# Patient Record
Sex: Female | Born: 1985 | Race: White | Hispanic: No | Marital: Single
Health system: Southern US, Community
[De-identification: ages and names within clinical notes are randomized; demographics above are authoritative.]

## PROBLEM LIST (undated history)

## (undated) DIAGNOSIS — K5190 Ulcerative colitis, unspecified, without complications: Secondary | ICD-10-CM

## (undated) DIAGNOSIS — M5430 Sciatica, unspecified side: Secondary | ICD-10-CM

## (undated) DIAGNOSIS — E282 Polycystic ovarian syndrome: Secondary | ICD-10-CM

---

## 2015-04-04 ENCOUNTER — Encounter: Payer: Self-pay | Admitting: Emergency Medicine

## 2015-04-04 ENCOUNTER — Emergency Department
Admission: EM | Admit: 2015-04-04 | Discharge: 2015-04-04 | Disposition: A | Discharge status: Home / Self Care | Payer: BLUE CROSS/BLUE SHIELD | Attending: Emergency Medicine | Admitting: Emergency Medicine

## 2015-04-04 ENCOUNTER — Emergency Department: Payer: BLUE CROSS/BLUE SHIELD

## 2015-04-04 DIAGNOSIS — Y998 Other external cause status: Secondary | ICD-10-CM | POA: Insufficient documentation

## 2015-04-04 DIAGNOSIS — X58XXXA Exposure to other specified factors, initial encounter: Secondary | ICD-10-CM | POA: Insufficient documentation

## 2015-04-04 DIAGNOSIS — Y9344 Activity, trampolining: Secondary | ICD-10-CM | POA: Insufficient documentation

## 2015-04-04 DIAGNOSIS — M23231 Derangement of other medial meniscus due to old tear or injury, right knee: Secondary | ICD-10-CM | POA: Insufficient documentation

## 2015-04-04 DIAGNOSIS — Y9289 Other specified places as the place of occurrence of the external cause: Secondary | ICD-10-CM | POA: Diagnosis not present

## 2015-04-04 DIAGNOSIS — M2391 Unspecified internal derangement of right knee: Secondary | ICD-10-CM

## 2015-04-04 DIAGNOSIS — S8991XA Unspecified injury of right lower leg, initial encounter: Secondary | ICD-10-CM | POA: Diagnosis present

## 2015-04-04 HISTORY — DX: Ulcerative colitis, unspecified, without complications: K51.90

## 2015-04-04 HISTORY — DX: Polycystic ovarian syndrome: E28.2

## 2015-04-04 HISTORY — DX: Sciatica, unspecified side: M54.30

## 2015-04-04 MED ORDER — ONDANSETRON HCL 4 MG/2ML IJ SOLN
INTRAMUSCULAR | Status: AC
  Filled 2015-04-04: qty 2

## 2015-04-04 MED ORDER — MORPHINE SULFATE (PF) 4 MG/ML IV SOLN
INTRAVENOUS | Status: AC
  Filled 2015-04-04: qty 1

## 2015-04-04 MED ORDER — MORPHINE SULFATE (PF) 4 MG/ML IV SOLN
4.0000 mg | Freq: Once | INTRAVENOUS | Status: AC
  Administered 2015-04-04: 4 mg via INTRAVENOUS

## 2015-04-04 MED ORDER — ONDANSETRON HCL 4 MG/2ML IJ SOLN
4.0000 mg | Freq: Once | INTRAMUSCULAR | Status: AC
  Administered 2015-04-04: 4 mg via INTRAVENOUS

## 2015-04-04 NOTE — ED Provider Notes (Addendum)
[facility] Emergency Department Provider Note  ____________________________________________  Time seen: Approximately 8:31 PM  I have reviewed the triage vital signs and the nursing notes.   HISTORY  Chief Complaint Knee Injury    HPI Priscilla Scott is a 29 y.o. female patient reports she was jumping on a trampoline and landed wrong and had something happen to her knee. Patient reports severe pain just below her right knee and it feels like it grinds every time she moves it.   Past Medical History  Diagnosis Date  . PCOS (polycystic ovarian syndrome)   . Sciatica   . Ulcerative colitis     There are no active problems to display for this patient.   No past surgical history on file.  No current outpatient prescriptions on file.  Allergies Review of patient's allergies indicates no known allergies.  No family history on file.  Social History Social History  Substance Use Topics  . Smoking status: None  . Smokeless tobacco: None  . Alcohol Use: None    Review of Systems Constitutional: No fever/chills Eyes: No visual changes. ENT: No sore throat. Cardiovascular: Denies chest pain. Respiratory: Denies shortness of breath. Gastrointestinal: No abdominal pain.  No nausea, no vomiting.  No diarrhea.  No constipation. Genitourinary: Negative for dysuria. Musculoskeletal: Negative for back pain. Skin: Negative for rash. Neurological: Negative for headaches, focal weakness or numbness.  10-point ROS otherwise negative.  ____________________________________________   PHYSICAL EXAM:  VITAL SIGNS: ED Triage Vitals  Enc Vitals Group     BP 04/04/15 1933 133/71 mmHg     Pulse Rate 04/04/15 1933 90     Resp 04/04/15 1933 16     Temp --      Temp src --      SpO2 04/04/15 1933 96 %     Weight 04/04/15 1933 290 lb (131.543 kg)     Height 04/04/15 1933 5\' 7"  (1.702 m)     Head Cir --      Peak Flow --      Pain Score 04/04/15 1934 7      Pain Loc --      Pain Edu? --      Excl. in GC? --     Constitutional: Alert and oriented. Well appearing and in no acute distress. Eyes: Conjunctivae are normal. PERRL. EOMI. Head: Atraumatic. Nose: No congestion/rhinnorhea. Mouth/Throat: Mucous membranes are moist.  Oropharynx non-erythematous. Neck: No stridor Cardiovascular: Normal rate, regular rhythm. Grossly normal heart sounds.  Good peripheral circulation. Respiratory: Normal respiratory effort.  No retractions. Lungs CTAB. Gastrointestinal: Soft and nontender. No distention. No abdominal bruits. No CVA tenderness. Musculoskeletal: No lower extremity tenderness nor edema except for medially below the right knee where it's tender and there is some bruising present  No joint effusions. Neurologic:  Normal speech and language. No gross focal neurologic deficits are appreciated. No gait instability. Skin:  Skin is warm, dry and intact. No rash noted. Psychiatric: Mood and affect are normal. Speech and behavior are normal.  ____________________________________________   LABS (all labs ordered are listed, but only abnormal results are displayed)  Labs Reviewed - No data to display ____________________________________________  EKG   ____________________________________________  RADIOLOGY  Radiology reads the x-ray as negative I do not see any pathology either ____________________________________________   PROCEDURES  I attempted to examine the knee but the patient reports it hurts more to much to try  ____________________________________________   INITIAL IMPRESSION / ASSESSMENT AND PLAN / ED COURSE  Pertinent labs & imaging results that were available during my care of the patient were reviewed by me and considered in my medical decision making (see chart for details).   ____________________________________________   FINAL CLINICAL IMPRESSION(S) / ED DIAGNOSES  Final diagnoses:  Internal derangement of  knee, right      Arnaldo Natal, MD 04/04/15 2112 to Dr. Val Riles they have her chart in Epic they will call her with appointment  Arnaldo Natal, MD 04/04/15 [phone]

## 2015-04-04 NOTE — ED Notes (Signed)
Pt states was jumping on a trampoline approx 1830 when she injured her right knee. Pt states had lateral patella rotation. Swelling noted. Pt received of fentanyl via iv from ems. Cms intact to toes.

## 2016-07-22 IMAGING — CR DG KNEE COMPLETE 4+V*R*
4 series · 4 of 4 positions shown · non-contrast
Comparison: None.

CLINICAL DATA: Jumping injury on a trampoline

EXAM:
RIGHT KNEE - COMPLETE 4+ VIEW

[knee ap]
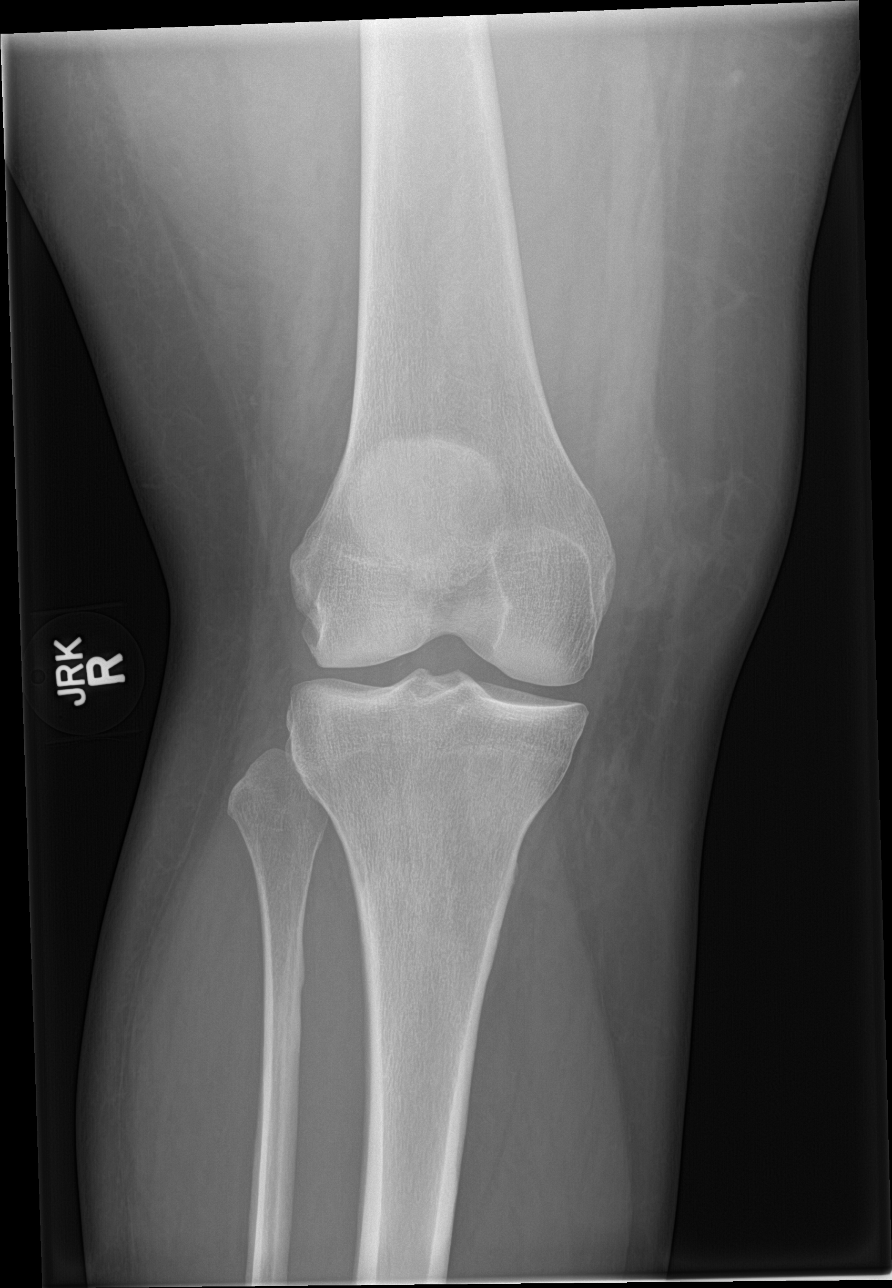

[knee obl (1 of 2)]
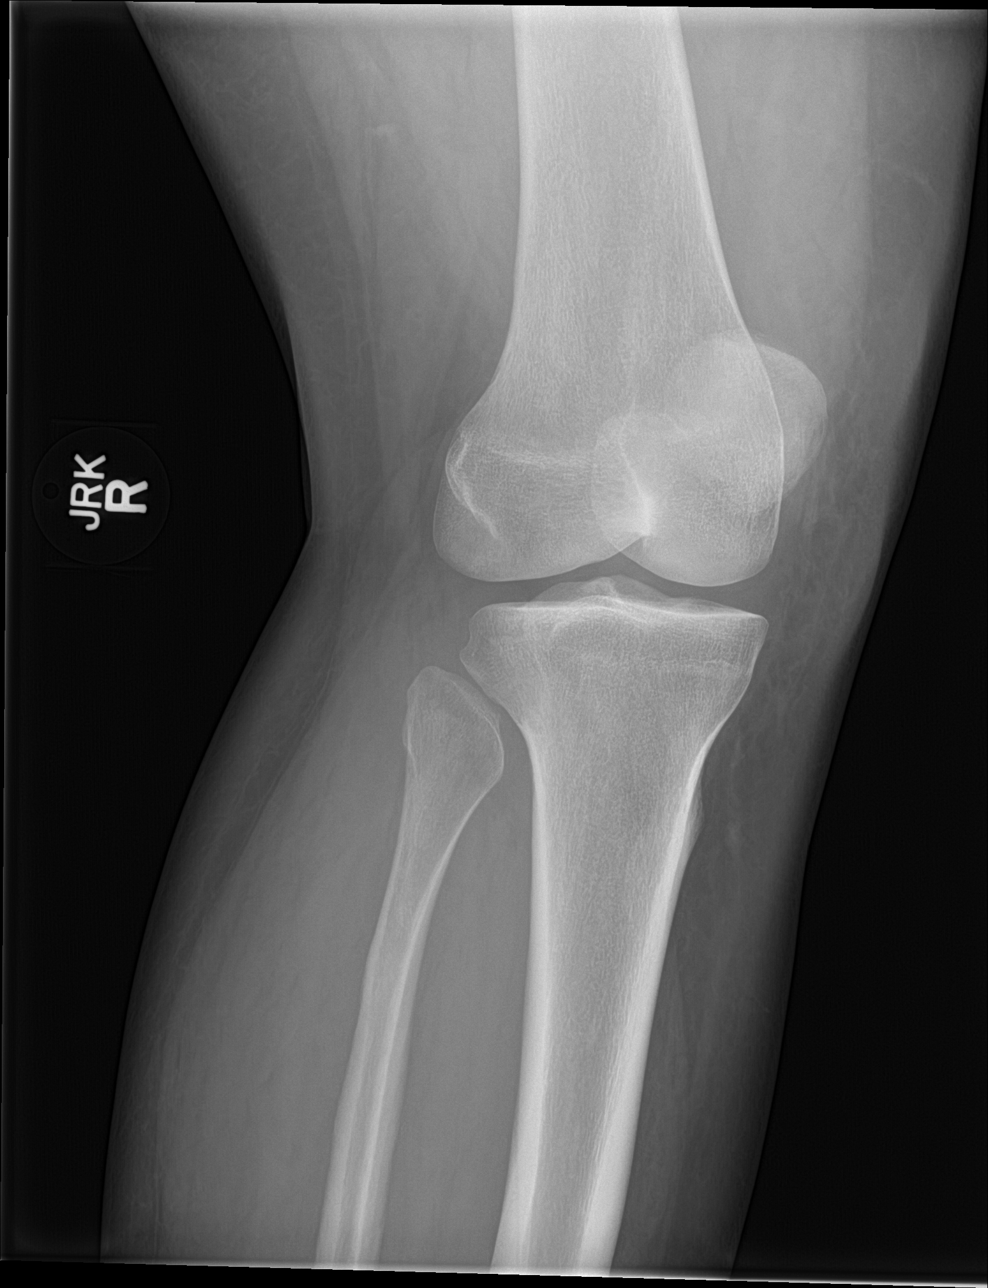

[knee obl (2 of 2)]
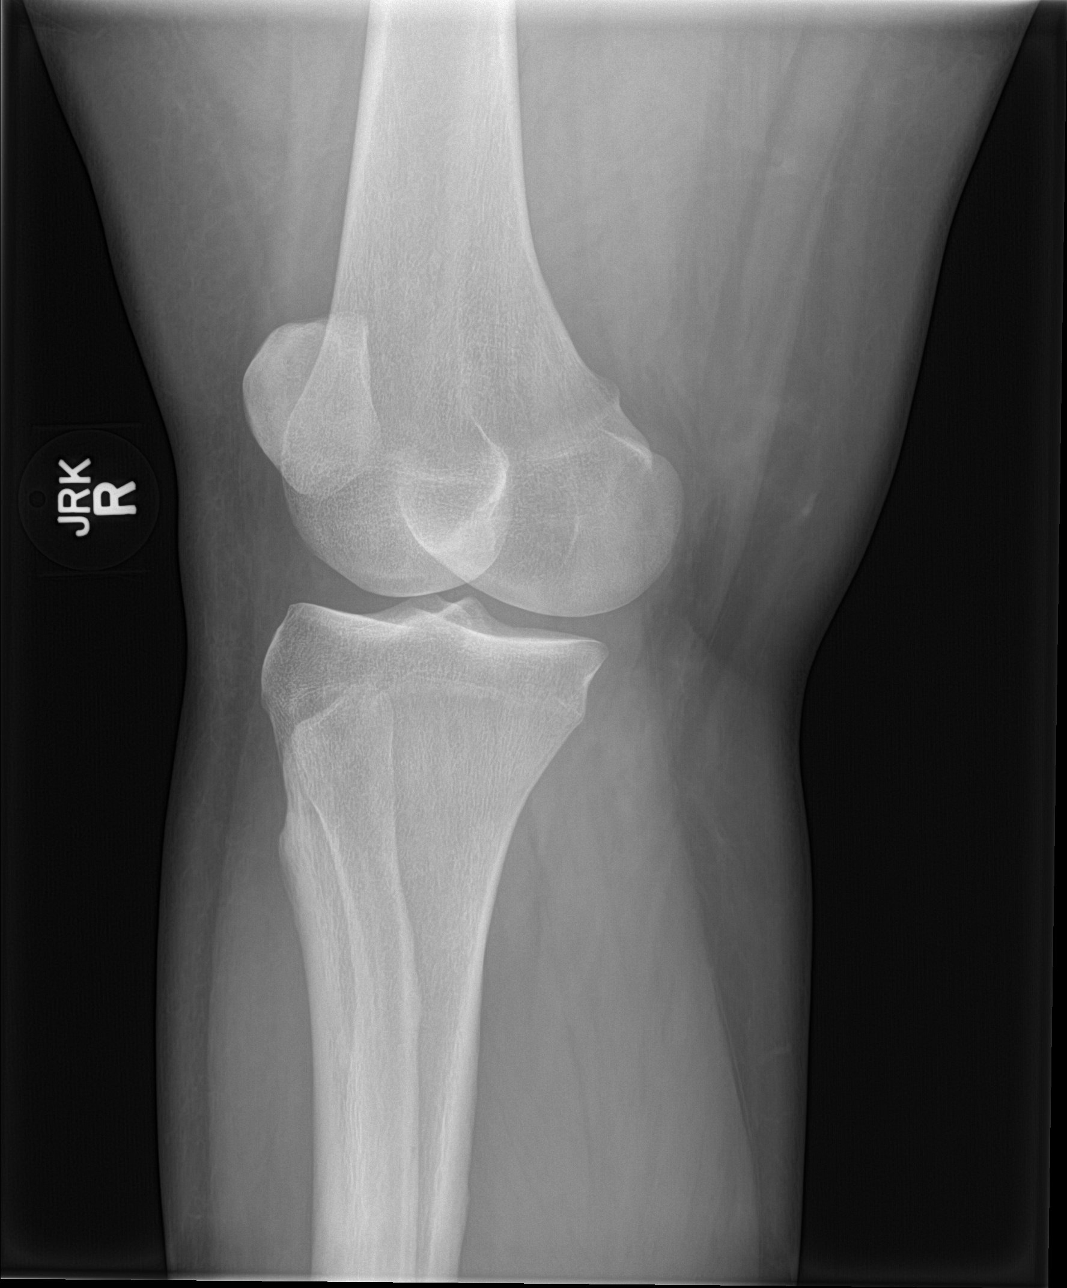

[knee lat]
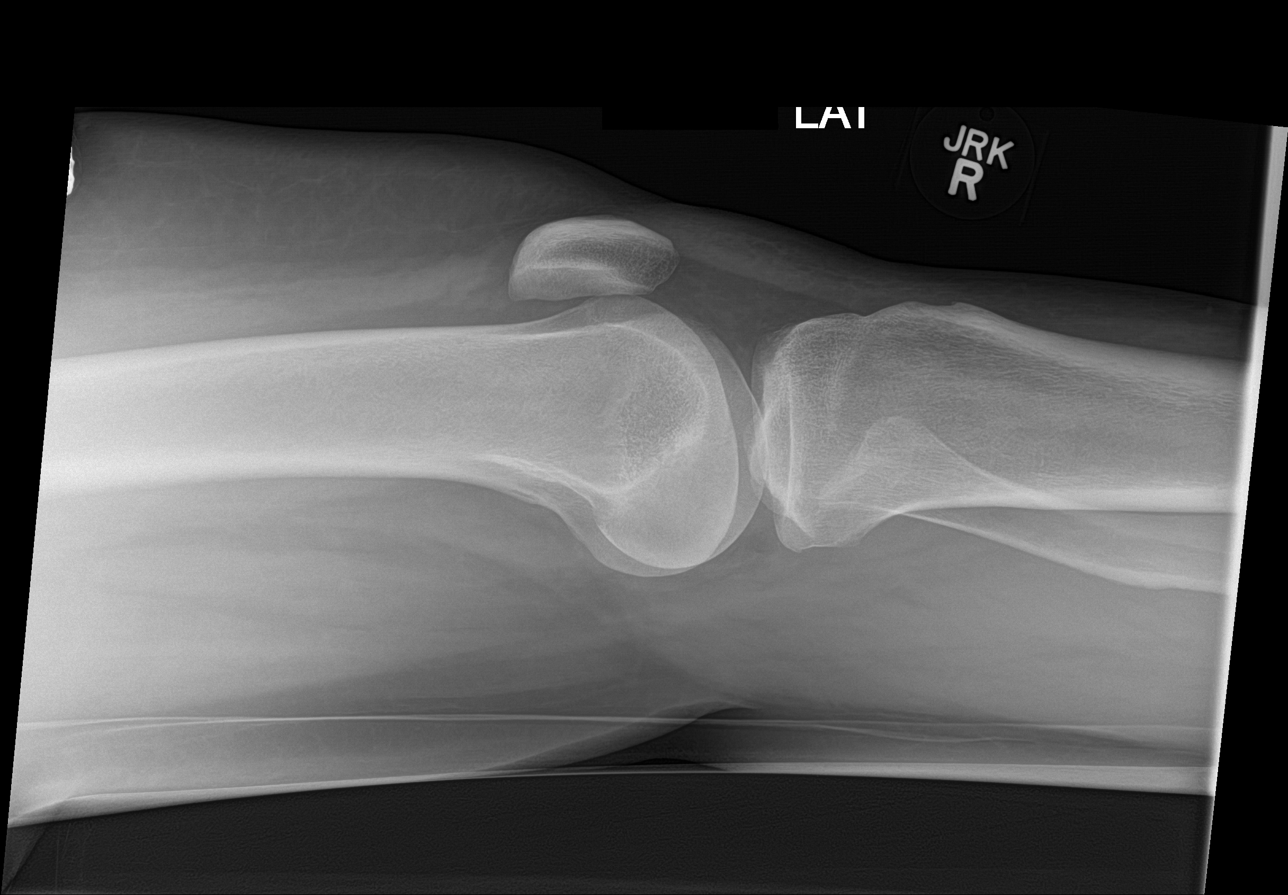

[4 of 4 positions shown; findings below may reference images not displayed]

FINDINGS: There is no evidence of fracture, dislocation, or joint effusion.
There is no evidence of arthropathy or other focal bone abnormality.
Soft tissues are unremarkable.
IMPRESSION: Negative.
